# Patient Record
Sex: Female | Born: 1990 | Race: Black or African American | Hispanic: No | Marital: Married
Health system: Southern US, Community
[De-identification: ages and names within clinical notes are randomized; demographics above are authoritative.]

## PROBLEM LIST (undated history)

## (undated) DIAGNOSIS — J45909 Unspecified asthma, uncomplicated: Secondary | ICD-10-CM

---

## 2017-11-30 ENCOUNTER — Encounter (HOSPITAL_COMMUNITY): Payer: Self-pay | Admitting: Emergency Medicine

## 2017-11-30 ENCOUNTER — Emergency Department (HOSPITAL_COMMUNITY)
Admission: EM | Admit: 2017-11-30 | Discharge: 2017-11-30 | Disposition: A | Discharge status: Home / Self Care | Payer: Self-pay | Attending: Emergency Medicine | Admitting: Emergency Medicine

## 2017-11-30 ENCOUNTER — Other Ambulatory Visit: Payer: Self-pay

## 2017-11-30 DIAGNOSIS — H6690 Otitis media, unspecified, unspecified ear: Secondary | ICD-10-CM

## 2017-11-30 DIAGNOSIS — H1031 Unspecified acute conjunctivitis, right eye: Secondary | ICD-10-CM

## 2017-11-30 DIAGNOSIS — H6592 Unspecified nonsuppurative otitis media, left ear: Secondary | ICD-10-CM | POA: Insufficient documentation

## 2017-11-30 DIAGNOSIS — J45909 Unspecified asthma, uncomplicated: Secondary | ICD-10-CM | POA: Insufficient documentation

## 2017-11-30 HISTORY — DX: Unspecified asthma, uncomplicated: J45.909

## 2017-11-30 LAB — CBG MONITORING, ED: Glucose-Capillary: 109 mg/dL — ABNORMAL HIGH (ref 65–99)

## 2017-11-30 MED ORDER — AMOXICILLIN-POT CLAVULANATE 875-125 MG PO TABS: 1.0000 | ORAL_TABLET | Freq: Two times a day (BID) | ORAL | 0 refills | Status: AC

## 2017-11-30 MED ORDER — ERYTHROMYCIN 5 MG/GM OP OINT
1.0000 "application " | TOPICAL_OINTMENT | Freq: Once | OPHTHALMIC | Status: AC
  Administered 2017-11-30: 1 via OPHTHALMIC

## 2017-11-30 NOTE — ED Triage Notes (Signed)
Patient complains of diminished hearing "like something is stuck in there" in the left ear. Also reports her son was recently diagnosed with conjunctivitis and reports that this morning her right eye was crusty when she woke up and both eyes have been watering.

## 2017-11-30 NOTE — Discharge Instructions (Addendum)
Please read attached information. If you experience any new or worsening signs or symptoms please return to the emergency room for evaluation. Please follow-up with your primary care provider or specialist as discussed. Please use medication prescribed only as directed and discontinue taking if you have any concerning signs or symptoms.   °

## 2017-11-30 NOTE — ED Provider Notes (Signed)
[facility] EMERGENCY DEPARTMENT Provider Note   CSN: [id number] Arrival date & time: 11/30/17  1210     History   Chief Complaint Chief Complaint  Patient presents with  . Eye Pain    HPI Jenna Fisher is a 27 y.o. female.  HPI  27 year old female presents today with complaints.  Patient notes that over the last 3 days she has had minor amount of redness and itchiness in her right eye.  She notes crusting around the eye in the morning, no significant discharge throughout the day.  She denies any changes in vision, pain in the eye or surrounding redness.  She notes that her son was recently diagnosed with conjunctivitis.  Patient also notes that around the same.  Of time she has had decreased hearing in her left ear, she notes minor upper respiratory congestion, no significant cough, no fever, no significant pain in the ear.  Past Medical History:  Diagnosis Date  . Asthma     There are no active problems to display for this patient.   History reviewed. No pertinent surgical history.   OB History   None      Home Medications    Prior to Admission medications   Medication Sig Start Date End Date Taking? Authorizing Provider  amoxicillin-clavulanate (AUGMENTIN) 875-125 MG tablet Take 1 tablet by mouth every 12 (twelve) hours. 11/30/17   Eyvonne Mechanic, PA-C    Family History No family history on file.  Social History Social History   Tobacco Use  . Smoking status: Not on file  Substance Use Topics  . Alcohol use: Not on file  . Drug use: Not on file     Allergies   Patient has no known allergies.   Review of Systems Review of Systems  All other systems reviewed and are negative.    Physical Exam Updated Vital Signs BP (!) 134/100 (BP Location: Right Arm)   Pulse 95   Temp 98.1 F (36.7 C) (Oral)   Resp 20   LMP 10/29/2017   SpO2 100%   Physical Exam  Constitutional: She is oriented to person, place, and time. She appears  well-developed and well-nourished.  HENT:  Head: Normocephalic and atraumatic.  Red erythematous bulging TM left side  Eyes: Pupils are equal, round, and reactive to light. Conjunctivae are normal. Right eye exhibits no discharge. Left eye exhibits no discharge. No scleral icterus.  Bulbar and palpebral conjunctival injection, no obvious corneal defects, no foreign bodies, no discharge noted extraocular is intact and pain-free, vision normal no surrounding redness  Neck: Normal range of motion. No JVD present. No tracheal deviation present.  Pulmonary/Chest: Effort normal. No stridor.  Neurological: She is alert and oriented to person, place, and time. Coordination normal.  Psychiatric: She has a normal mood and affect. Her behavior is normal. Judgment and thought content normal.  Nursing note and vitals reviewed.    ED Treatments / Results  Labs (all labs ordered are listed, but only abnormal results are displayed) Labs Reviewed - No data to display  EKG None  Radiology No results found.  Procedures Procedures (including critical care time)  Medications Ordered in ED Medications  erythromycin ophthalmic ointment 1 application (has no administration in time range)     Initial Impression / Assessment and Plan / ED Course  I have reviewed the triage vital signs and the nursing notes.  Pertinent labs & imaging results that were available during my care of the patient were reviewed by  me and considered in my medical decision making (see chart for details).     Patient presentation was consistent with otitis media.  Patient will be treated with Augmentin.  Patient also has conjunctivitis question viral in nature.  Patient will be discharged with erythromycin if symptoms do not improve or worsen.  Patient has no complicating features, she is well-appearing in no acute distress.  She does not wear contacts.  She is given strict return precautions and follow-up information.  She  verbalized understanding and agreement to today's plan had no further questions or concerns.  Final Clinical Impressions(s) / ED Diagnoses   Final diagnoses:  Acute otitis media, unspecified otitis media type  Acute conjunctivitis of right eye, unspecified acute conjunctivitis type    ED Discharge Orders        Ordered    amoxicillin-clavulanate (AUGMENTIN) 875-125 MG tablet  Every 12 hours     11/30/17 1638       Eyvonne Mechanic, PA-C 11/30/17 1647    Rolland Porter, MD 12/07/17 [phone]

## 2018-11-01 ENCOUNTER — Other Ambulatory Visit (HOSPITAL_COMMUNITY): Payer: Self-pay

## 2018-11-01 DIAGNOSIS — Z3482 Encounter for supervision of other normal pregnancy, second trimester: Secondary | ICD-10-CM

## 2018-12-06 ENCOUNTER — Encounter (HOSPITAL_COMMUNITY): Payer: Self-pay | Admitting: *Deleted

## 2018-12-07 ENCOUNTER — Other Ambulatory Visit: Payer: Self-pay

## 2018-12-07 ENCOUNTER — Ambulatory Visit (HOSPITAL_COMMUNITY)
Admission: RE | Admit: 2018-12-07 | Discharge: 2018-12-07 | Disposition: A | Discharge status: Home / Self Care | Payer: Medicaid Other | Source: Ambulatory Visit | Attending: Obstetrics and Gynecology | Admitting: Obstetrics and Gynecology

## 2018-12-07 DIAGNOSIS — Z363 Encounter for antenatal screening for malformations: Secondary | ICD-10-CM

## 2018-12-07 DIAGNOSIS — Z3482 Encounter for supervision of other normal pregnancy, second trimester: Secondary | ICD-10-CM | POA: Diagnosis present

## 2018-12-07 DIAGNOSIS — Z3A19 19 weeks gestation of pregnancy: Secondary | ICD-10-CM

## 2020-04-26 IMAGING — US US MFM OB COMP + 14 WK
1 series · 13 of 28 positions shown · non-contrast
Comparison: none

[Series 1: us mfm ob comp + 14 wk · 95 acquisitions, 13 frames shown]
[im 4/95]
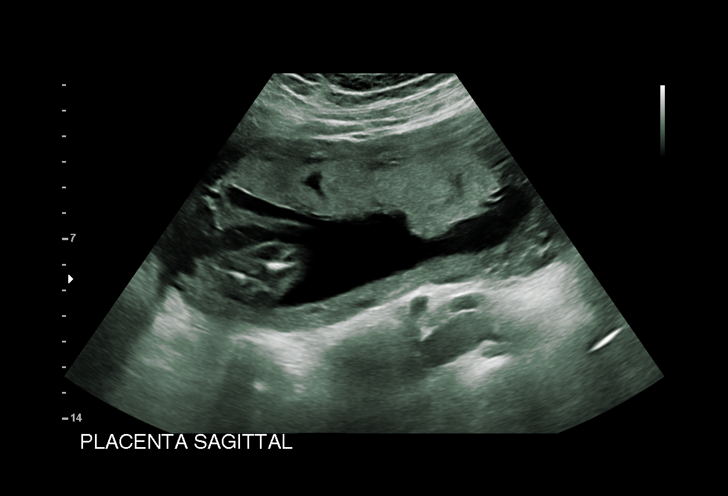
[im 11/95]
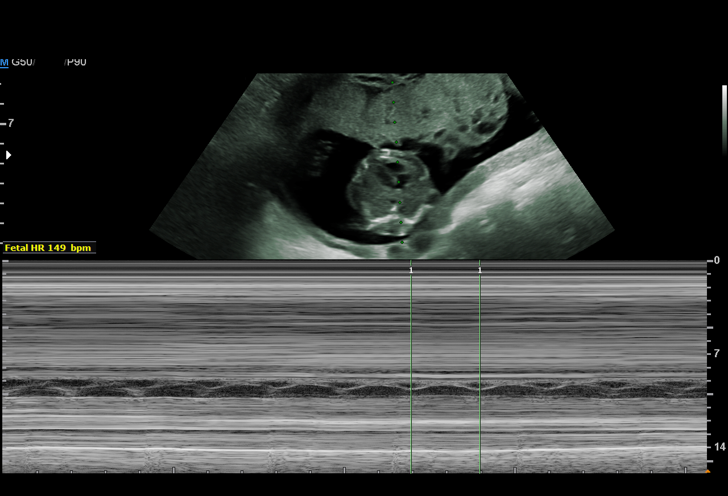
[im 18/95]
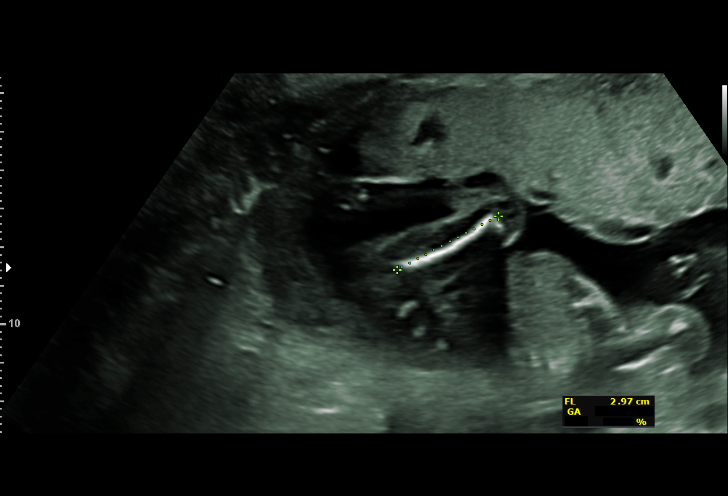
[im 25/95]
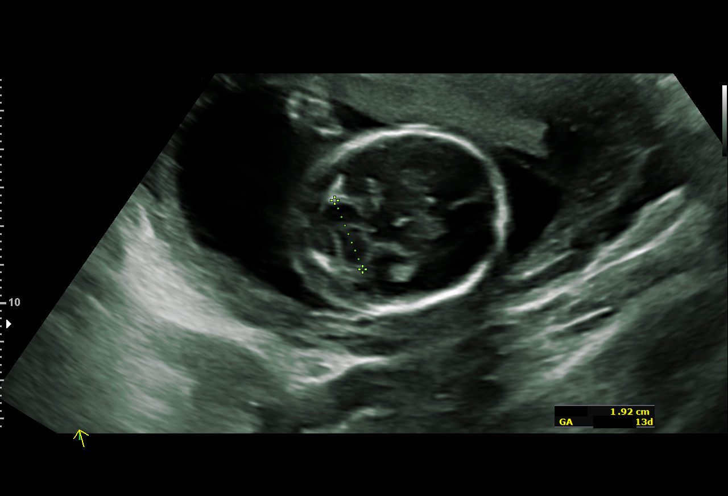
[im 32/95]
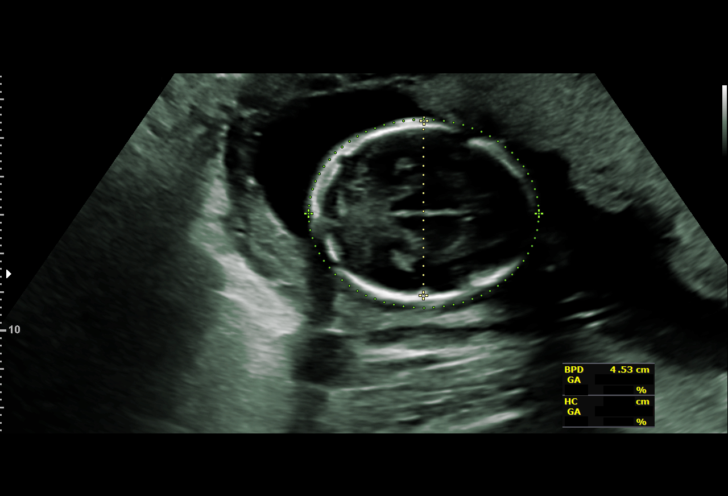
[im 39/95]
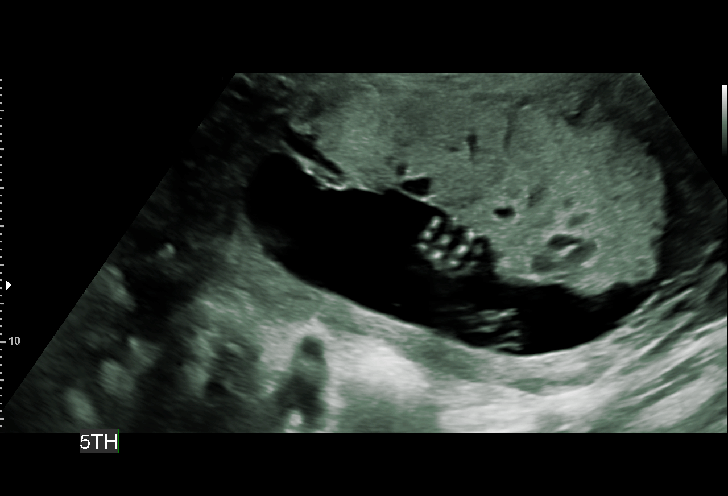
[im 49/95]
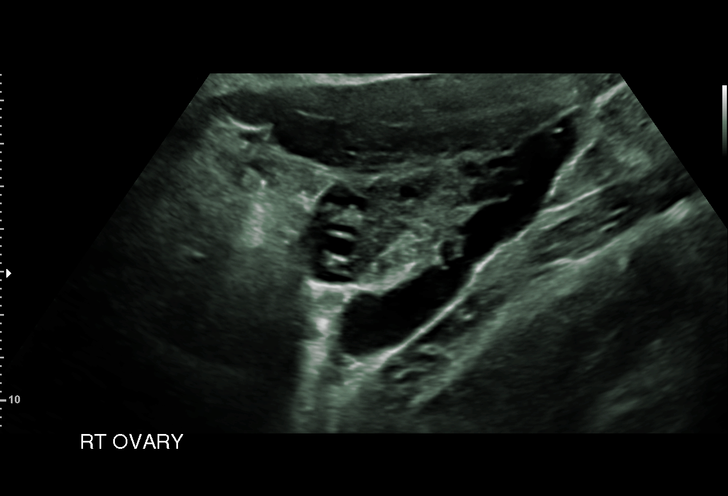
[im 56/95]
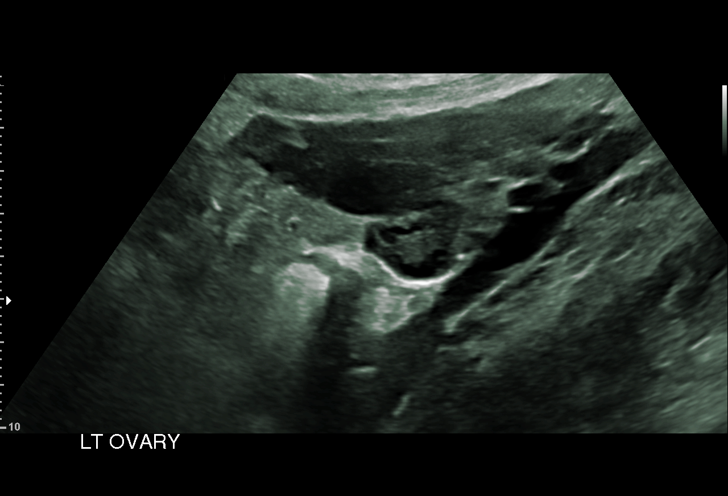
[im 63/95]
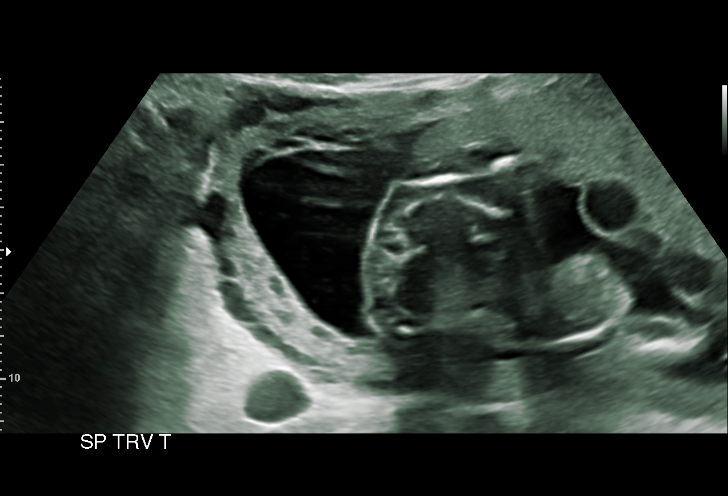
[im 70/95]
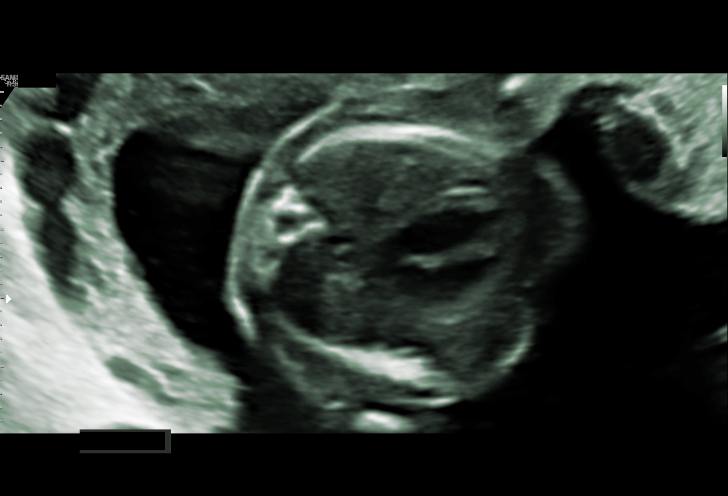
[im 77/95]
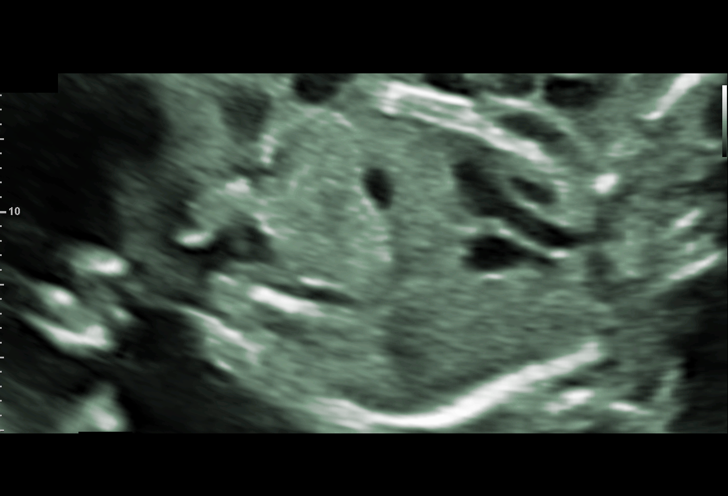
[im 84/95]
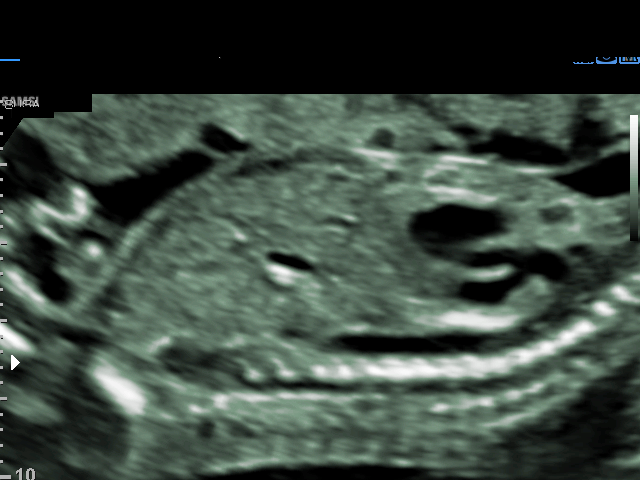
[im 91/95]
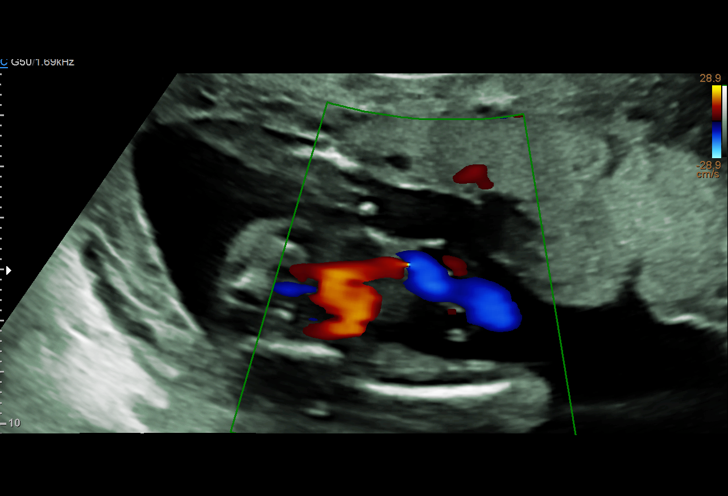

[13 of 28 positions shown; findings below may reference images not displayed]

6658 [REDACTED]

  1  US MFM OB COMP + 14 WK               76805.01     INGANATHI OLIVIA
 ----------------------------------------------------------------------

 ----------------------------------------------------------------------
Indications

  Encounter for antenatal screening for
  malformations
  19 weeks gestation of pregnancy
 ----------------------------------------------------------------------
Fetal Evaluation

 Num Of Fetuses:          1
 Fetal Heart Rate(bpm):   149
 Cardiac Activity:        Observed
 Presentation:            Variable to cephalic
 Placenta:                Anterior
 P. Cord Insertion:       Not well visualized

 Amniotic Fluid
 AFI FV:      Within normal limits

                             Largest Pocket(cm)

Biometry

 BPD:      45.3  mm     G. Age:  19w 5d         74  %    CI:        70.85   %    70 - 86
                                                         FL/HC:       17.4  %    16.1 -
 HC:      171.5  mm     G. Age:  19w 5d         71  %    HC/AC:       1.20       1.09 -
 AC:      143.3  mm     G. Age:  19w 4d         63  %    FL/BPD:      65.8  %
 FL:       29.8  mm     G. Age:  19w 2d         45  %    FL/AC:       20.8  %    20 - 24
 HUM:      29.8  mm     G. Age:  19w 6d         67  %
 CER:      19.2  mm     G. Age:  18w 4d         35  %
 NFT:       4.1  mm

 LV:        5.9  mm
 CM:        4.1  mm
 Est. FW:     295   gm   0 lb 10 oz      51  %
OB History

 Gravidity:    1
Gestational Age

 Clinical EDD:  19w 1d                                        EDD:   05/02/19
 U/S Today:     19w 4d                                        EDD:   04/29/19
 Best:          19w 1d     Det. By:  Clinical EDD             EDD:   05/02/19
Anatomy

 Cranium:               Appears normal         LVOT:                   Appears normal
 Cavum:                 Appears normal         Aortic Arch:            Appears normal
 Ventricles:            Appears normal         Ductal Arch:            Appears normal
 Choroid Plexus:        Appears normal         Diaphragm:              Appears normal
 Cerebellum:            Appears normal         Stomach:                Appears normal, left
                                                                       sided
 Posterior Fossa:       Appears normal         Abdomen:                Appears normal
 Nuchal Fold:           Appears normal         Abdominal Wall:         Appears nml (cord
                                                                       insert, abd wall)
 Face:                  Appears normal         Cord Vessels:           Appears normal (3
                        (orbits and profile)                           vessel cord)
 Lips:                  Appears normal         Kidneys:                Appear normal
 Palate:                Not well visualized    Bladder:                Appears normal
 Thoracic:              Appears normal         Spine:                  Appears normal
 Heart:                 Appears normal         Upper Extremities:      Appears normal
                        (4CH, axis, and
                        situs)
 RVOT:                  Appears normal         Lower Extremities:      Appears normal

 Other:  Male gender. Nasal bone visualized. 3VV visualized. Heels and 5th
         digit visualized.
Cervix Uterus Adnexa

 Cervix
 Normal appearance by transabdominal scan.

 Left Ovary
 Within normal limits.

 Right Ovary
 Within normal limits.
Impression

 We performed fetal anatomy scan. No makers of
 aneuploidies or fetal structural defects are seen. Fetal
 biometry is consistent with her previously-established dates.
 Amniotic fluid is normal and good fetal activity is seen.
Recommendations

 Follow-up scans as clinically indicated.
                 Jumper, Nya
